# Patient Record
Sex: Female | Born: 1945 | Race: White | Hispanic: No | Marital: Married | Smoking: Never smoker
Health system: Southern US, Community
[De-identification: ages and names within clinical notes are randomized; demographics above are authoritative.]

## PROBLEM LIST (undated history)

## (undated) HISTORY — PX: ABDOMINAL HYSTERECTOMY: SHX81

---

## 1997-12-23 ENCOUNTER — Emergency Department (HOSPITAL_COMMUNITY): Admission: EM | Admit: 1997-12-23 | Discharge: 1997-12-23 | Payer: Self-pay | Admitting: Emergency Medicine

## 1998-01-07 ENCOUNTER — Other Ambulatory Visit: Admission: RE | Admit: 1998-01-07 | Discharge: 1998-01-07 | Payer: Self-pay | Admitting: Gynecology

## 1998-09-27 ENCOUNTER — Other Ambulatory Visit: Admission: RE | Admit: 1998-09-27 | Discharge: 1998-09-27 | Payer: Self-pay | Admitting: Gynecology

## 1999-10-18 ENCOUNTER — Ambulatory Visit (HOSPITAL_BASED_OUTPATIENT_CLINIC_OR_DEPARTMENT_OTHER): Admission: RE | Admit: 1999-10-18 | Discharge: 1999-10-18 | Payer: Self-pay | Admitting: Orthopedic Surgery

## 2001-07-17 ENCOUNTER — Other Ambulatory Visit: Admission: RE | Admit: 2001-07-17 | Discharge: 2001-07-17 | Payer: Self-pay | Admitting: Obstetrics and Gynecology

## 2002-06-10 ENCOUNTER — Other Ambulatory Visit: Admission: RE | Admit: 2002-06-10 | Discharge: 2002-06-10 | Payer: Self-pay | Admitting: Obstetrics and Gynecology

## 2003-05-02 ENCOUNTER — Emergency Department (HOSPITAL_COMMUNITY): Admission: EM | Admit: 2003-05-02 | Discharge: 2003-05-02 | Payer: Self-pay | Admitting: *Deleted

## 2003-10-01 ENCOUNTER — Other Ambulatory Visit: Admission: RE | Admit: 2003-10-01 | Discharge: 2003-10-01 | Payer: Self-pay | Admitting: Obstetrics and Gynecology

## 2005-09-26 ENCOUNTER — Other Ambulatory Visit: Admission: RE | Admit: 2005-09-26 | Discharge: 2005-09-26 | Payer: Self-pay | Admitting: Obstetrics and Gynecology

## 2006-11-27 ENCOUNTER — Encounter: Admission: RE | Admit: 2006-11-27 | Discharge: 2006-11-27 | Payer: Self-pay | Admitting: Sports Medicine

## 2006-12-12 ENCOUNTER — Encounter: Admission: RE | Admit: 2006-12-12 | Discharge: 2006-12-12 | Payer: Self-pay | Admitting: Sports Medicine

## 2007-03-14 ENCOUNTER — Encounter: Admission: RE | Admit: 2007-03-14 | Discharge: 2007-03-14 | Payer: Self-pay | Admitting: Sports Medicine

## 2007-05-14 ENCOUNTER — Ambulatory Visit (HOSPITAL_COMMUNITY): Admission: RE | Admit: 2007-05-14 | Discharge: 2007-05-14 | Payer: Self-pay | Admitting: Neurosurgery

## 2007-05-29 ENCOUNTER — Other Ambulatory Visit: Admission: RE | Admit: 2007-05-29 | Discharge: 2007-05-29 | Payer: Self-pay | Admitting: Gynecology

## 2008-07-17 IMAGING — CR DG LUMBAR SPINE 2-3V
1 series · 1 of 1 positions shown · non-contrast
Comparison: None.

CLINICAL DATA: Localization for L5-S1 microdiskectomy. 
 LUMBAR SPINE - 2 VIEW:

[view not recorded]
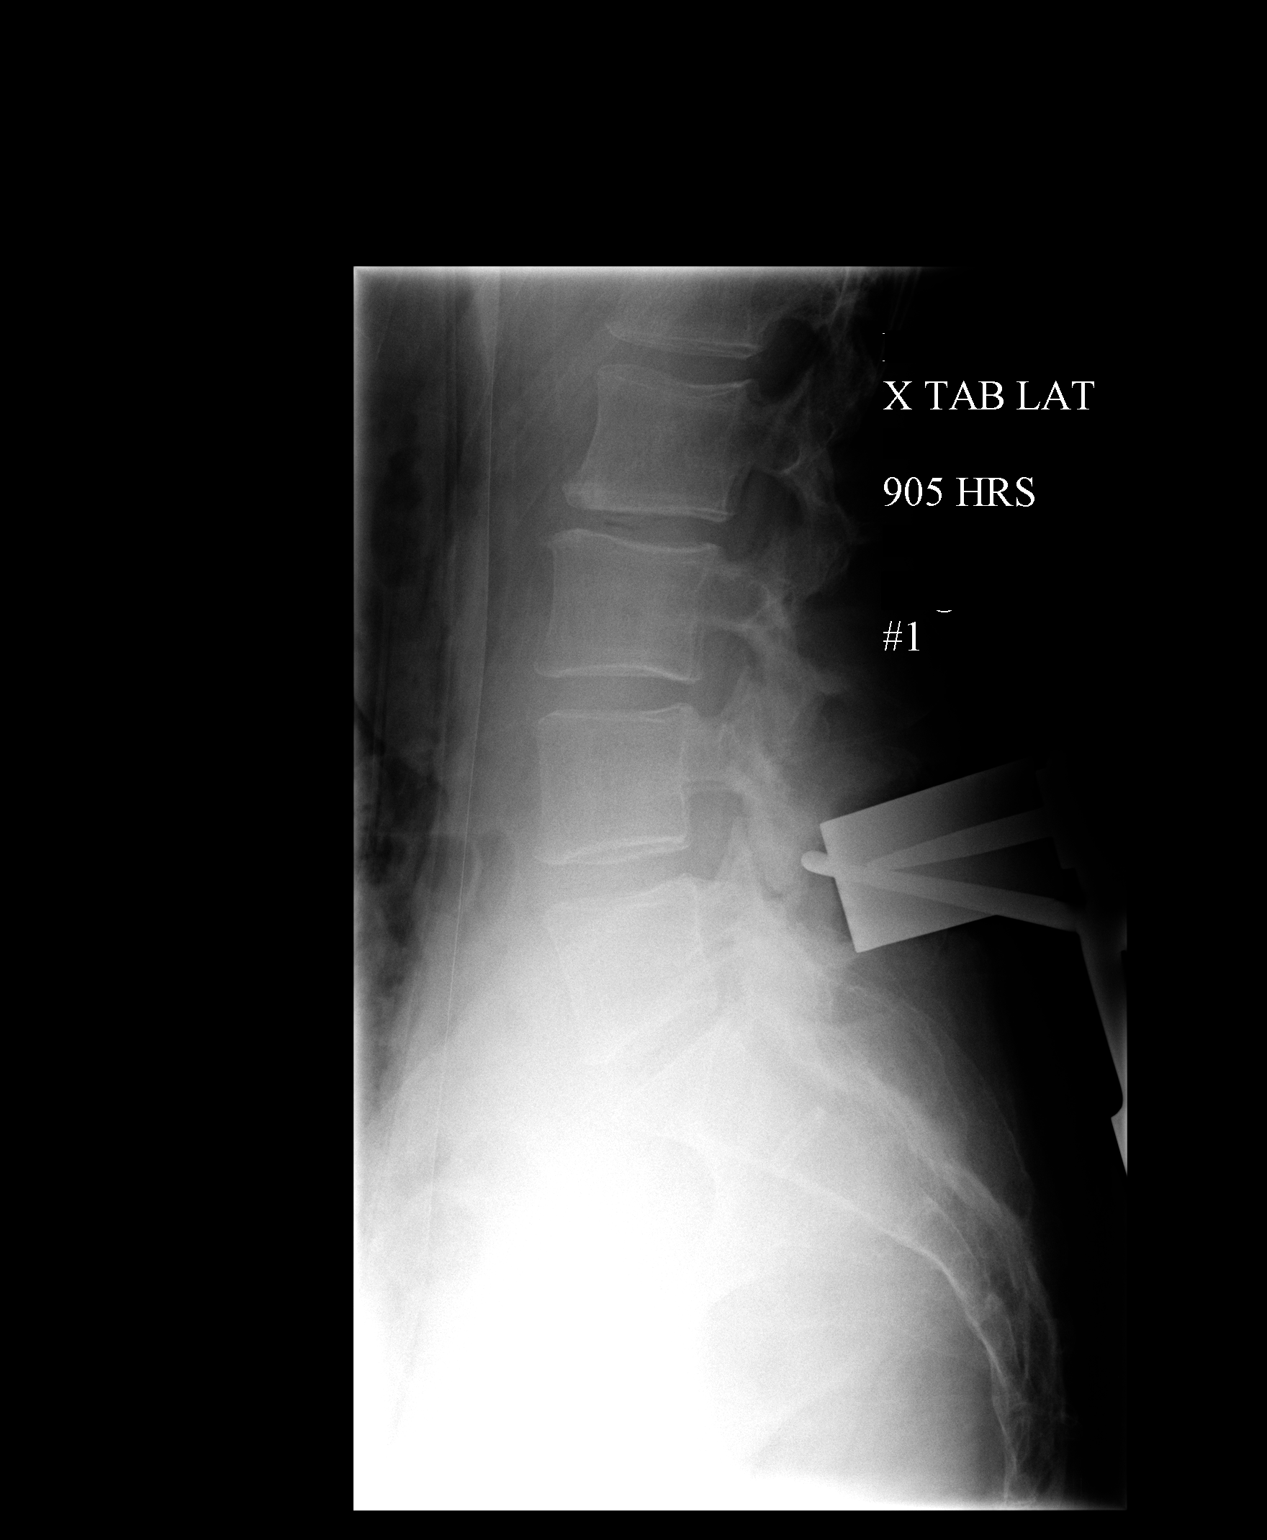

[1 of 1 positions shown; findings below may reference images not displayed]

FINDINGS: Image #1 at 3634 hours reveals there are instruments posterior to the neural canal at L4-5. 
 Image #2 at 1521 reveals there are now instruments posterior to the neural canal at L5-S1.
IMPRESSION: Localization for surgery as above.

## 2008-10-08 ENCOUNTER — Encounter: Payer: Self-pay | Admitting: Gynecology

## 2008-10-08 ENCOUNTER — Ambulatory Visit: Payer: Self-pay | Admitting: Gynecology

## 2008-10-08 ENCOUNTER — Other Ambulatory Visit: Admission: RE | Admit: 2008-10-08 | Discharge: 2008-10-08 | Payer: Self-pay | Admitting: Gynecology

## 2008-10-15 ENCOUNTER — Ambulatory Visit: Payer: Self-pay | Admitting: Gynecology

## 2008-10-26 ENCOUNTER — Ambulatory Visit: Payer: Self-pay | Admitting: Gynecology

## 2010-08-23 NOTE — Op Note (Signed)
NAMEJAZZLYN, Ana Wood             ACCOUNT NO.:  [id number]   MEDICAL RECORD NO.:  [id number]          PATIENT TYPE:  OIB   LOCATION:  3599                         FACILITY:  [facility]   PHYSICIAN:  Danae Orleans. Venetia Maxon, M.D.  DATE OF BIRTH:  Sep 01, 1945   DATE OF PROCEDURE:  05/14/2007  DATE OF DISCHARGE:                               OPERATIVE REPORT   PREOPERATIVE DIAGNOSES:  Right L5-S1 disk herniation with spondylosis,  degenerative disk disease and radiculopathy.   POSTOPERATIVE DIAGNOSES:  Right L5-S1 disk herniation with spondylosis,  degenerative disease and radiculopathy.   PROCEDURE:  Right L5-S1 microdiskectomy with microdissection.   SURGEON:  Danae Orleans. Venetia Maxon, M.D.   ASSISTANT:  Payton Doughty, M.D.  Georgiann Cocker, RN   ANESTHESIA:  General endotracheal anesthesia.   ESTIMATED BLOOD LOSS:  Minimal.   COMPLICATIONS:  None.   DISPOSITION:  To Recovery.   INDICATIONS:  [name] is a 65 year old woman with a large disk  herniation at L5-S1 on the right.  She had progressively worsening pain  despite conservative therapy, including injections.  It was elected to  take her to surgery for microdiskectomy.   PROCEDURE:  [name] was brought to the operating room.  Following  satisfactory uncomplicated induction of general endotracheal anesthesia  and placement of intravenous lines, the patient was placed in the prone  position on the Wilson frame.  Her low back was then prepped and draped  in the usual sterile fashion.  The area of planned incision was  infiltrated with 0.25% Marcaine and 0.5% lidocaine with 1:200,000  epinephrine.  Incision was made in the midline and carried through to  the lumbodorsal fascia.  This was incised on the right side of midline.  Subperiosteal dissection was performed, exposing the L5-S1 interspace.  Intraoperative x-ray demonstrated the marker probe at L4-5 level;  consequently the marker probe was moved down one level.  Subsequent x-  ray demonstrated correct orientation at the L5-S1 level.  A hemi-semi  laminectomy at L5 was then performed with a high-speed drill and  completed with Kerrison rongeurs; and the foraminotomy was performed for  removal of overlying superior aspect of the S1 lamina.  The ligamentum  flavum was detached and removed in piecemeal fashion.  The thecal sac  and S1 nerve root were decompressed.  The microscope was brought into  the field, using microdissection technique.  The S1 nerve root and  thecal sac were mobilized medially, exposing thinly contained fragment  of herniated disk material.  Multiple fragments of disk material were  removed; used to directly extend from the interspace.  So, consequently  a variety of pituitary rongeurs were used within the interspace,  although care was taken not to aggressively instrument the interspace  with curets or denude the endplates.  After residual loose disk material  was removed, hemostasis was assured.  The operative site was bathed in  Depo-Medrol and fentanyl.  The self-retaining retractor was removed.  The lumbodorsal fascia was closed with 0 Vicryl sutures; subcutaneous  tissues were approximated with 2-0 Vicryl interrupted inverted sutures,  and skin edges were approximated with interrupted  3-0 Vicryl  subcuticular stitch.  The wound was dressed with Dermabond.  The patient  was x-rayed in the operating room and taken to recovery room in stable,  satisfactory condition; having tolerated the operation well.   COUNTS:  Correct at the end of the case.      Danae Orleans. Venetia Maxon, M.D.  Electronically Signed     JDS/MEDQ  D:  05/14/2007  T:  05/14/2007  Job:  [id number]

## 2010-08-26 NOTE — Op Note (Signed)
[place]  Patient:    Ana Wood, Ana Wood                      MRN: [id number] Proc. Date: 10/18/99 Attending:  Katy Fitch. Naaman Plummer., M.D. CC:         Katy Fitch. Sypher, Montez Hageman., M.D. (2)                           Operative Report  PREOPERATIVE DIAGNOSIS:  Entrapment neuropathy, median nerve, right carpal tunnel.  POSTOPERATIVE DIAGNOSIS:  Entrapment neuropathy, median nerve, right carpal tunnel.  OPERATION PERFORMED:  Release of right transverse carpal ligament.  OPERATING SURGEON:  Josephine Igo, M.D.  ANESTHESIA:  General by mask.  ANESTHESIOLOGIST:  Dr. Gypsy Balsam.  INDICATIONS:  The patient is a 65 year old woman who has had a history of numbness affecting her right hand.  Clinical examination suggested carpal tunnel syndrome.  Electrodiagnostic studies confirmed median neuropathy at the right wrist.  Due to failure of nonoperative measures, the patient is brought to the operating room at this time for release of her right transverse carpal ligament.  DESCRIPTION OF PROCEDURE:  [name] was brought to the operating room and placed in supine position on the operating table.  Following induction of general anesthesia by mask, the right arm was prepped with Betadine soap and solution and sterilely draped.  Following exsanguination of the limb with an Esmarch bandage, the arterial tourniquet was inflated to 220 mmHg.  The procedure commenced with a short incision in line of the ring finger in the palm.  The subcutaneous tissues were carefully divided revealing the palmar fascia.  This was split longitudinally to reveal the common sensory branch of the median nerve.  These were followed back to the transverse carpal ligament which was carefully isolated from the median nerve.  The ligament was released on its ulnar border extending into the distal forearm.  This widely opened the carpal canal.  No masses or other predicaments were noted.   Bleeding along the margins of the released ligament was controlled with bipolar cautery.  The wound was then repaired with intradermal 3-0 Prolene suture.  A compressive dressing was applied after infiltration of the wound margins with 0.25% Marcaine without epinephrine.  The wound was dressed with a Steri-Strip followed by a voluminous gauze dressing with a volar plaster splint maintaining the wrist in 5 degrees dorsiflexion.  There were no apparent complications.  The patient tolerated the surgery and anesthesia well and was transferred to the recovery room with stable vital signs.  She will be discharged with a prescription for Percocet 5/325 one or two p.o. q.4-6h. p.r.n. pain, 20 tablets without refill. DD:  10/18/99 TD:  10/18/99 Job: [id number]

## 2010-12-30 LAB — CBC
HCT: 38.4
Platelets: 255
RDW: 13.2

## 2010-12-30 LAB — BASIC METABOLIC PANEL
BUN: 7
Creatinine, Ser: 0.56
GFR calc non Af Amer: >60
Glucose, Bld: 96
Potassium: 4.5

## 2021-10-10 ENCOUNTER — Other Ambulatory Visit: Payer: Self-pay

## 2021-10-10 ENCOUNTER — Emergency Department
Admission: EM | Admit: 2021-10-10 | Discharge: 2021-10-10 | Disposition: A | Discharge status: Home / Self Care | Payer: Medicare Other | Attending: Emergency Medicine | Admitting: Emergency Medicine

## 2021-10-10 DIAGNOSIS — R002 Palpitations: Secondary | ICD-10-CM | POA: Diagnosis present

## 2021-10-10 DIAGNOSIS — I471 Supraventricular tachycardia: Secondary | ICD-10-CM

## 2021-10-10 LAB — COMPREHENSIVE METABOLIC PANEL
ALT: 21 U/L (ref 0–44)
AST: 38 U/L (ref 15–41)
Albumin: 3.9 g/dL (ref 3.5–5.0)
Alkaline Phosphatase: 53 U/L (ref 38–126)
Anion gap: 8 (ref 5–15)
BUN: 25 mg/dL — ABNORMAL HIGH (ref 8–23)
CO2: 24 mmol/L (ref 22–32)
Calcium: 8.9 mg/dL (ref 8.9–10.3)
Chloride: 110 mmol/L (ref 98–111)
Creatinine, Ser: 0.71 mg/dL (ref 0.44–1.00)
GFR, Estimated: >60 mL/min (ref >60)
Glucose, Bld: 94 mg/dL (ref 70–99)
Potassium: 3.6 mmol/L (ref 3.5–5.1)
Sodium: 142 mmol/L (ref 135–145)
Total Bilirubin: 0.8 mg/dL (ref 0.3–1.2)
Total Protein: 6.3 g/dL — ABNORMAL LOW (ref 6.5–8.1)

## 2021-10-10 LAB — CBC WITH DIFFERENTIAL/PLATELET
Abs Immature Granulocytes: 0.03 10*3/uL (ref 0.00–0.07)
Basophils Absolute: 0.1 10*3/uL (ref 0.0–0.1)
Basophils Relative: 1 %
Eosinophils Absolute: 0 10*3/uL (ref 0.0–0.5)
Eosinophils Relative: 1 %
HCT: 36.5 % (ref 36.0–46.0)
Hemoglobin: 11.8 g/dL — ABNORMAL LOW (ref 12.0–15.0)
Immature Granulocytes: 0 %
Lymphocytes Relative: 23 %
Lymphs Abs: 1.8 10*3/uL (ref 0.7–4.0)
MCH: 31.1 pg (ref 26.0–34.0)
MCHC: 32.3 g/dL (ref 30.0–36.0)
MCV: 96.1 fL (ref 80.0–100.0)
Monocytes Absolute: 0.6 10*3/uL (ref 0.1–1.0)
Monocytes Relative: 8 %
Neutro Abs: 5.6 10*3/uL (ref 1.7–7.7)
Neutrophils Relative %: 67 %
Platelets: 202 10*3/uL (ref 150–400)
RBC: 3.8 MIL/uL — ABNORMAL LOW (ref 3.87–5.11)
RDW: 13.5 % (ref 11.5–15.5)
WBC: 8.2 10*3/uL (ref 4.0–10.5)
nRBC: 0 % (ref 0.0–0.2)

## 2021-10-10 LAB — TROPONIN I (HIGH SENSITIVITY): Troponin I (High Sensitivity): 7 ng/L (ref <18)

## 2021-10-10 MED ORDER — SODIUM CHLORIDE 0.9 % IV BOLUS
1000.0000 mL | Freq: Once | INTRAVENOUS | Status: AC
  Administered 2021-10-10: 1000 mL via INTRAVENOUS

## 2021-10-10 NOTE — ED Provider Notes (Signed)
[facility] Provider Note    Event Date/Time   First MD Initiated Contact with Patient 10/10/21 1747     (approximate)  History   Chief Complaint: SVT  HPI  Ana Wood is a 76 y.o. female with no significant past medical history presents to the emergency department for palpitations and chest tightness.  According to the patient at approximately 430 or so she began feeling palpitations sensation like her heart was racing.  Patient states a tightness sensation in the chest as well.  Denies any dizziness denies any nausea denies diaphoresis.  EMS states on arrival patient appeared to be in SVT around 180 bpm.  Patient was given 6 mg of adenosine with conversion to a normal sinus rhythm.  Upon arrival patient continues to appear to be in a normal sinus rhythm on the telemetry monitor.  Patient is anxious appearing and admits to feeling quite anxious.  Physical Exam   Triage Vital Signs: ED Triage Vitals [10/10/21 1746]  Enc Vitals Group     BP      Pulse Rate 75     Resp 15     Temp 98.7 F (37.1 C)     Temp Source Oral     SpO2 100 %     Weight 112 lb (50.8 kg)     Height 5\' 6"  (1.676 m)     Head Circumference      Peak Flow      Pain Score 0     Pain Loc      Pain Edu?      Excl. in GC?     Most recent vital signs: Vitals:   10/10/21 1746  Pulse: 75  Resp: 15  Temp: 98.7 F (37.1 C)  SpO2: 100%    General: Awake, no distress.  CV:  Good peripheral perfusion.  Regular rate and rhythm  Resp:  Normal effort.  Equal breath sounds bilaterally.  Abd:  No distention.  Soft, nontender.  No rebound or guarding.  ED Results / Procedures / Treatments   EKG  EKG viewed and interpreted by myself shows a normal sinus rhythm at 72 bpm with a narrow QRS, normal axis, normal intervals, no concerning ST changes.  MEDICATIONS ORDERED IN ED: Medications - No data to display   IMPRESSION / MDM / ASSESSMENT AND PLAN / ED COURSE  I reviewed the  triage vital signs and the nursing notes.  Patient's presentation is most consistent with acute presentation with potential threat to life or bodily function.  Patient presents to the emergency department for likely SVT.  I have reviewed the EMS rhythm strips patient appear to be in SVT around 180 bpm.  To convert to normal sinus with 6 mg of adenosine.  Here the patient appears well she is somewhat anxious but otherwise reassuring physical exam, reassuring vitals.  We will check labs, IV hydrate and continue to closely monitor the patient.  No history of any medical illnesses, no cardiac disease in the past no history of SVT in the past.  Patient's lab work is reassuring.  Patient continues to appear well.  CBC is normal.  Chemistry is normal.  Troponin negative.  Patient has had no further episodes in the emergency department.  We will discharge the patient home with PCP and cardiology follow-up.  Patient will follow-up for consideration of Holter monitoring.  I discussed vagal maneuvers at home such as Valsalva she could try in the future if her symptoms were  to recur.  I also discussed return precautions.  FINAL CLINICAL IMPRESSION(S) / ED DIAGNOSES   Supraventricular tachycardia  Note:  This document was prepared using Dragon voice recognition software and may include unintentional dictation errors.   Minna Antis, MD 10/10/21 Barry Brunner

## 2021-10-10 NOTE — Discharge Instructions (Addendum)
Please call the number provided for cardiology to arrange a follow-up appointment as soon as possible.  Return to the emergency department for any further episodes of rapid heart rate any dizziness chest pain or shortness of breath.

## 2021-10-10 NOTE — ED Triage Notes (Signed)
Pt presents to ED via [facility] with c/o of "not feeling right" after working in the garden today. Pt states she took a shower after working outside and that's when symptoms started. Pt denies any cardiac HX.   EMS gave 6mg  of adenosine PTA which converted pt back to a NSR. Pt denies any CP at this time but did c/o of chest pressure after adenosine was given. Pt is A&Ox4 at this time.    EMS gave 1L of NS PTA as well.

## 2021-10-10 NOTE — ED Notes (Signed)
Pt and husband give verbal consent to Dc

## 2021-10-12 ENCOUNTER — Encounter: Payer: Self-pay | Admitting: Emergency Medicine

## 2021-10-12 ENCOUNTER — Other Ambulatory Visit: Payer: Self-pay

## 2021-10-12 ENCOUNTER — Ambulatory Visit
Admission: EM | Admit: 2021-10-12 | Discharge: 2021-10-12 | Disposition: A | Discharge status: Home / Self Care | Payer: Medicare Other

## 2021-10-12 DIAGNOSIS — L247 Irritant contact dermatitis due to plants, except food: Secondary | ICD-10-CM

## 2021-10-12 MED ORDER — DEXAMETHASONE SODIUM PHOSPHATE 10 MG/ML IJ SOLN
10.0000 mg | Freq: Once | INTRAMUSCULAR | Status: AC
  Administered 2021-10-12: 10 mg via INTRAMUSCULAR

## 2021-10-12 MED ORDER — PREDNISONE 10 MG (21) PO TBPK: ORAL_TABLET | ORAL | 0 refills | Status: AC

## 2021-10-12 NOTE — Discharge Instructions (Signed)
Take the prednisone according to the package instructions.  Use over-the-counter Allegra, Claritin, or Zyrtec during the day as needed for itching and use Benadryl 50 mg at bedtime.  This may also help you sleep as a steroids may interrupt your sleep cycle.  Apply calamine lotion to the rash on your extremities to help dry it up.  Do not use calamine lotion on your face.  For facial lesions, if you develop any changes in your vision or itching and irritation in your eyes please go to the ER for evaluation or follow-up with ophthalmology.  

## 2021-10-12 NOTE — ED Provider Notes (Signed)
[facility]    CSN: [id number] Arrival date & time: 10/12/21  5329      History   Chief Complaint Chief Complaint  Patient presents with   Rash    HPI Ana Wood is a 76 y.o. female.   HPI  30 old female here for evaluation of skin complaint.  Patient reports that she was exposed to poison ivy and she has itchy red lesions on both forearms.  She states that she has been taking over-the-counter antihistamines to help with the itch as well as using over-the-counter topical poison ivy medication without significant improvement of symptoms.  She reports that her husband has the exact same rash that she does and he was treated with IM steroids and an oral steroid pack.  History reviewed. No pertinent past medical history.  There are no problems to display for this patient.   Past Surgical History:  Procedure Laterality Date   ABDOMINAL HYSTERECTOMY      OB History   No obstetric history on file.      Home Medications    Prior to Admission medications   Medication Sig Start Date End Date Taking? Authorizing Provider  predniSONE (STERAPRED UNI-PAK 21 TAB) 10 MG (21) TBPK tablet Take 6 tablets on day 1, 5 tablets day 2, 4 tablets day 3, 3 tablets day 4, 2 tablets day 5, 1 tablet day 6 10/12/21  Yes Becky Augusta, NP  rosuvastatin (CRESTOR) 20 MG tablet Take 20 mg by mouth daily. 08/24/21  Yes [provider]    Family History History reviewed. No pertinent family history.  Social History Social History   Tobacco Use   Smoking status: Never   Smokeless tobacco: Never  Vaping Use   Vaping Use: Never used  Substance Use Topics   Alcohol use: Not Currently   Drug use: Not Currently     Allergies   Patient has no known allergies.   Review of Systems Review of Systems  Constitutional:  Negative for fever.  Skin:  Positive for color change and rash.  Hematological: Negative.      Physical Exam Triage Vital Signs ED Triage Vitals   Enc Vitals Group     BP 10/12/21 1046 (!) 120/50     Pulse Rate 10/12/21 1046 (!) 50     Resp 10/12/21 1046 16     Temp 10/12/21 1046 98.3 F (36.8 C)     Temp Source 10/12/21 1046 Oral     SpO2 10/12/21 1046 97 %     Weight 10/12/21 1044 111 lb 15.9 oz (50.8 kg)     Height 10/12/21 1044 5\' 6"  (1.676 m)     Head Circumference --      Peak Flow --      Pain Score 10/12/21 1043 0     Pain Loc --      Pain Edu? --      Excl. in GC? --    No data found.  Updated Vital Signs BP (!) 120/50 (BP Location: Right Arm)   Pulse (!) 50   Temp 98.3 F (36.8 C) (Oral)   Resp 16   Ht 5\' 6"  (1.676 m)   Wt 111 lb 15.9 oz (50.8 kg)   SpO2 97%   BMI 18.08 kg/m   Visual Acuity Right Eye Distance:   Left Eye Distance:   Bilateral Distance:    Right Eye Near:   Left Eye Near:    Bilateral Near:     Physical  Exam Vitals and nursing note reviewed.  Constitutional:      Appearance: Normal appearance. She is not ill-appearing.  HENT:     Head: Normocephalic and atraumatic.  Skin:    General: Skin is warm and dry.     Capillary Refill: Capillary refill takes less than 2 seconds.     Findings: Erythema and rash present.  Neurological:     General: No focal deficit present.     Mental Status: She is alert and oriented to person, place, and time.  Psychiatric:        Mood and Affect: Mood normal.        Behavior: Behavior normal.        Thought Content: Thought content normal.        Judgment: Judgment normal.      UC Treatments / Results  Labs (all labs ordered are listed, but only abnormal results are displayed) Labs Reviewed - No data to display  EKG   Radiology No results found.  Procedures Procedures (including critical care time)  Medications Ordered in UC Medications  dexamethasone (DECADRON) injection 10 mg (has no administration in time range)    Initial Impression / Assessment and Plan / UC Course  I have reviewed the triage vital signs and the nursing  notes.  Pertinent labs & imaging results that were available during my care of the patient were reviewed by me and considered in my medical decision making (see chart for details).  Patient is a pleasant, nontoxic-appearing 30 old female here for evaluation of itchy red rash that she has on both forearms.  She states that swelling her forearms and has been present for the past several days.  She has been using over-the-counter antihistamines and topical agents to help with the itching.  On exam patient has scattered maculopapular lesions on both forearms that are in linear streaks.  No vesicles noted.  No surrounding erythema, warmth to the lesions, or edema.  Her exam is consistent with contact dermatitis, most likely secondary to plant irritant.  We will treat her with an IM dose of Decadron here per her request and discharge her home on a prednisone pack to start tomorrow morning.  She is to continue topical calamine lotion to help dry the lesions as well as oral antihistamines to help with itching.  Return precautions reviewed.   Final Clinical Impressions(s) / UC Diagnoses   Final diagnoses:  Irritant contact dermatitis due to plants, except food     Discharge Instructions      Take the prednisone according to the package instructions.  Use over-the-counter Allegra, Claritin, or Zyrtec during the day as needed for itching and use Benadryl 50 mg at bedtime.  This may also help you sleep as a steroids may interrupt your sleep cycle.  Apply calamine lotion to the rash on your extremities to help dry it up.  Do not use calamine lotion on your face.  For facial lesions, if you develop any changes in your vision or itching and irritation in your eyes please go to the ER for evaluation or follow-up with ophthalmology.      ED Prescriptions     Medication Sig Dispense Auth. Provider   predniSONE (STERAPRED UNI-PAK 21 TAB) 10 MG (21) TBPK tablet Take 6 tablets on day 1, 5 tablets day 2, 4  tablets day 3, 3 tablets day 4, 2 tablets day 5, 1 tablet day 6 21 tablet Becky Augusta, NP      PDMP not  reviewed this encounter.   Becky Augusta, NP 10/12/21 1114

## 2021-10-12 NOTE — ED Triage Notes (Signed)
Pt c/o poison ivy on bilateral arms. Started about 3 days ago.

## 2022-12-27 ENCOUNTER — Other Ambulatory Visit: Payer: Self-pay | Admitting: Student

## 2022-12-27 ENCOUNTER — Other Ambulatory Visit: Payer: Medicare Other

## 2022-12-27 DIAGNOSIS — I2510 Atherosclerotic heart disease of native coronary artery without angina pectoris: Secondary | ICD-10-CM

## 2022-12-27 DIAGNOSIS — R002 Palpitations: Secondary | ICD-10-CM

## 2022-12-28 ENCOUNTER — Ambulatory Visit
Admission: RE | Admit: 2022-12-28 | Discharge: 2022-12-28 | Disposition: A | Discharge status: Home / Self Care | Payer: Medicare Other | Source: Ambulatory Visit | Attending: Student | Admitting: Student

## 2022-12-28 DIAGNOSIS — I2510 Atherosclerotic heart disease of native coronary artery without angina pectoris: Secondary | ICD-10-CM

## 2022-12-28 DIAGNOSIS — R002 Palpitations: Secondary | ICD-10-CM
